# Patient Record
Sex: Female | Born: 1935 | Hispanic: No | Smoking: Never smoker
Health system: Southern US, Community
[De-identification: ages and names within clinical notes are randomized; demographics above are authoritative.]

## PROBLEM LIST (undated history)

## (undated) DIAGNOSIS — K5792 Diverticulitis of intestine, part unspecified, without perforation or abscess without bleeding: Secondary | ICD-10-CM

## (undated) DIAGNOSIS — I2510 Atherosclerotic heart disease of native coronary artery without angina pectoris: Secondary | ICD-10-CM

## (undated) DIAGNOSIS — I10 Essential (primary) hypertension: Secondary | ICD-10-CM

## (undated) HISTORY — PX: JOINT REPLACEMENT: SHX530

---

## 2001-12-09 ENCOUNTER — Ambulatory Visit (HOSPITAL_COMMUNITY): Admission: RE | Admit: 2001-12-09 | Discharge: 2001-12-09 | Payer: Self-pay | Admitting: Family Medicine

## 2001-12-09 ENCOUNTER — Encounter: Payer: Self-pay | Admitting: Family Medicine

## 2002-12-14 ENCOUNTER — Encounter: Payer: Self-pay | Admitting: *Deleted

## 2002-12-14 ENCOUNTER — Ambulatory Visit (HOSPITAL_COMMUNITY): Admission: RE | Admit: 2002-12-14 | Discharge: 2002-12-14 | Payer: Self-pay | Admitting: *Deleted

## 2003-01-05 ENCOUNTER — Ambulatory Visit (HOSPITAL_COMMUNITY): Admission: RE | Admit: 2003-01-05 | Discharge: 2003-01-05 | Payer: Self-pay | Admitting: *Deleted

## 2009-04-25 ENCOUNTER — Encounter: Payer: Self-pay | Admitting: Cardiovascular Disease

## 2009-05-06 ENCOUNTER — Encounter: Payer: Self-pay | Admitting: Cardiovascular Disease

## 2009-05-20 ENCOUNTER — Encounter: Payer: Self-pay | Admitting: Cardiovascular Disease

## 2009-05-23 ENCOUNTER — Ambulatory Visit (HOSPITAL_COMMUNITY): Admission: RE | Admit: 2009-05-23 | Discharge: 2009-05-23 | Payer: Self-pay | Admitting: Orthopedic Surgery

## 2009-05-28 ENCOUNTER — Telehealth (INDEPENDENT_AMBULATORY_CARE_PROVIDER_SITE_OTHER): Payer: Self-pay | Admitting: *Deleted

## 2009-06-22 DIAGNOSIS — K921 Melena: Secondary | ICD-10-CM

## 2009-06-25 ENCOUNTER — Ambulatory Visit: Payer: Self-pay | Admitting: Cardiovascular Disease

## 2009-06-25 DIAGNOSIS — R011 Cardiac murmur, unspecified: Secondary | ICD-10-CM

## 2009-06-25 DIAGNOSIS — I10 Essential (primary) hypertension: Secondary | ICD-10-CM

## 2009-06-28 ENCOUNTER — Ambulatory Visit: Payer: Self-pay

## 2009-06-28 ENCOUNTER — Encounter: Payer: Self-pay | Admitting: Cardiovascular Disease

## 2009-07-03 ENCOUNTER — Ambulatory Visit: Payer: Self-pay | Admitting: Cardiovascular Disease

## 2009-07-04 ENCOUNTER — Telehealth (INDEPENDENT_AMBULATORY_CARE_PROVIDER_SITE_OTHER): Payer: Self-pay | Admitting: *Deleted

## 2009-07-05 LAB — CONVERTED CEMR LAB
CO2: 32 meq/L (ref 19–32)
Calcium: 9.2 mg/dL (ref 8.4–10.5)
Creatinine, Ser: 0.6 mg/dL (ref 0.4–1.2)
GFR calc non Af Amer: 102.62 mL/min (ref >60)
Sodium: 141 meq/L (ref 135–145)

## 2009-07-18 ENCOUNTER — Inpatient Hospital Stay (HOSPITAL_COMMUNITY): Admission: RE | Admit: 2009-07-18 | Discharge: 2009-07-22 | Payer: Self-pay | Admitting: Orthopedic Surgery

## 2009-08-13 ENCOUNTER — Encounter (INDEPENDENT_AMBULATORY_CARE_PROVIDER_SITE_OTHER): Payer: Self-pay | Admitting: *Deleted

## 2009-08-19 ENCOUNTER — Ambulatory Visit: Payer: Self-pay | Admitting: Cardiovascular Disease

## 2009-08-19 DIAGNOSIS — I080 Rheumatic disorders of both mitral and aortic valves: Secondary | ICD-10-CM | POA: Insufficient documentation

## 2009-09-23 ENCOUNTER — Encounter: Admission: RE | Admit: 2009-09-23 | Discharge: 2009-09-23 | Payer: Self-pay | Admitting: Orthopedic Surgery

## 2009-11-13 ENCOUNTER — Encounter: Admission: RE | Admit: 2009-11-13 | Discharge: 2010-01-20 | Payer: Self-pay | Admitting: Neurology

## 2010-04-16 ENCOUNTER — Encounter: Admission: RE | Admit: 2010-04-16 | Discharge: 2010-04-16 | Payer: Self-pay | Admitting: Family Medicine

## 2010-09-11 IMAGING — CR DG HIP 1V PORT*L*
1 series · 1 of 1 positions shown · non-contrast
Comparison: None

CLINICAL DATA: History given of left hip arthroplasty procedure.

PORTABLE LEFT HIP - 1 VIEW

[AP]
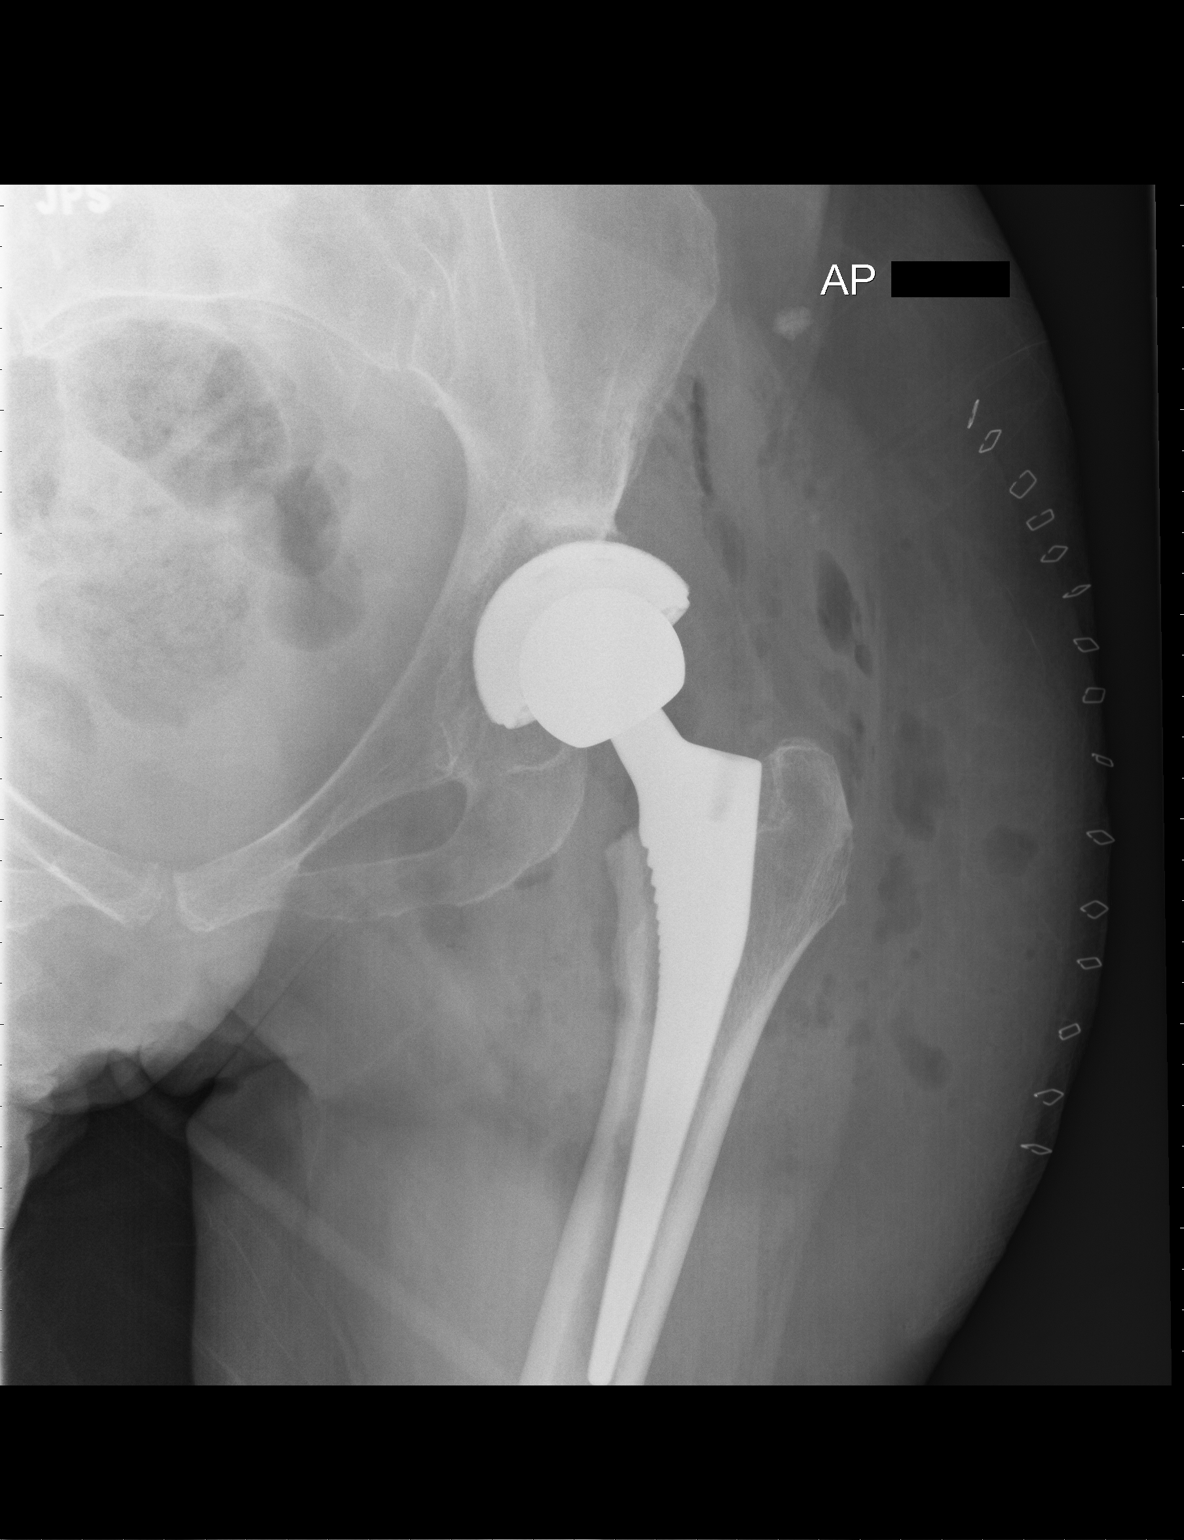

[1 of 1 positions shown; findings below may reference images not displayed]

FINDINGS: AP image shows a bipolar type hip arthroplasty procedure
has been performed.  There is expected relationship of the
acetabular and femoral components in the AP projection.  Soft
tissue edema and air is seen at the operative site.  Skin staples
are seen. There is a mildly osteopenic appearance of the bones.
IMPRESSION: Left hip arthroplasty procedure has been performed.

## 2010-10-13 ENCOUNTER — Encounter: Payer: Self-pay | Admitting: Cardiovascular Disease

## 2010-10-13 ENCOUNTER — Ambulatory Visit: Payer: Self-pay | Admitting: Cardiovascular Disease

## 2010-10-13 ENCOUNTER — Ambulatory Visit (HOSPITAL_COMMUNITY)
Admission: RE | Admit: 2010-10-13 | Discharge: 2010-10-13 | Payer: Self-pay | Source: Home / Self Care | Attending: Cardiovascular Disease | Admitting: Cardiovascular Disease

## 2010-11-16 ENCOUNTER — Encounter: Payer: Self-pay | Admitting: Family Medicine

## 2010-11-27 NOTE — Assessment & Plan Note (Signed)
Summary: 1 year fu appt/mt   Visit Type:  1 yr f/u Referring Provider:  Dr. Lajoyce Corners Primary Provider:  Larita Fife.[facility]  CC:  No complaints.  History of Present Illness: 77 yo WF with history of HTN, degenerative joint disease, diverticulitis seen in August 2010  for pre-operative risk assessment prior to planned hip replacement. She has been a healthy person for most of her life. She has never smoked and has never been treated for cardiac conditions. She denied any chest pain, SOB, palpitations, near syncope, syncope, PND, orthopnea, lower ext edema or awareness of irregularity of her heart rhythm. She was scheduled for left hip replacement but was found to have an abnormal EKG with LBBB and the surgery was delayed until she could be seen. Her echo showed normal function with LA enlargement and moderate MR. She had her left hip replacement on 07/18/09 and did well. She is here today for follow up. She has no complaints.    Repeat echo today with images reviewed by myself. Her LV function is still normal. Her MR is moderate but has not progressed. Both atria are enlarged. She has been feeling well. No chest pain or SOB. She has been on propranolol for some time. Her heart rate is always in the 40s. She denies dizziness, near syncope or syncope. NO chest pain or SOB. Her BP has been elevated lately. Her EKG today shows NSR with LBBB, chronic.    Current Medications (verified): 1)  Lisinopril 40 Mg Tabs (Lisinopril) .... Take One Tablet By Mouth Daily 2)  Propranolol Hcl Cr 160 Mg Xr24h-Cap (Propranolol Hcl) .Marland Kitchen.. 1 Cap Two Times A Day 3)  Multivitamins   Tabs (Multiple Vitamin) .Marland Kitchen.. 1 Tab Once Daily 4)  Calcium-Magnesium-Zinc 333-133-8.3 Mg Tabs (Calcium-Magnesium-Zinc) .... 2 Tabs Once Daily 5)  Fish Oil 1000 Mg Caps (Omega-3 Fatty Acids) .Marland Kitchen.. 1 Cap Once Daily 6)  Glucosamine-Chondroitin-Vit D3 1500-1200-800 Mg-Mg-Unit Pack (Glucosamine-Chondroitin-Vit D3) .Marland Kitchen.. 1 Tab Two Times A  Day 7)  Alprazolam 0.5 Mg Tabs (Alprazolam) .Marland Kitchen.. 1-2 To 2 Tab At Bedtime As Needed  Allergies: 1)  ! Pcn  Past History:  Past Medical History: HTN Diverticuitis Degenerative disease of the left hip Mitral regurgitation (moderate by echo 12/11)    Social History: Reviewed history from 06/25/2009 and no changes required. Retired Music therapist, 4 children No tobacco No alcohol No illicit drugs  Review of Systems  The patient denies fatigue, malaise, fever, weight gain/loss, vision loss, decreased hearing, hoarseness, chest pain, palpitations, shortness of breath, prolonged cough, wheezing, sleep apnea, coughing up blood, abdominal pain, blood in stool, nausea, vomiting, diarrhea, heartburn, incontinence, blood in urine, muscle weakness, joint pain, leg swelling, rash, skin lesions, headache, fainting, dizziness, depression, anxiety, enlarged lymph nodes, easy bruising or bleeding, and environmental allergies.    Vital Signs:  Patient profile:   77 year old female Height:      64.5 inches Weight:      126.50 pounds BMI:     21.46 Pulse rate:   48 / minute Pulse rhythm:   irregular BP sitting:   142 / 80  (left arm) Cuff size:   regular  Vitals Entered By: Danielle Rankin, CMA (October 13, 2010 10:45 AM)  Physical Exam  General:  General: Well developed, well nourished, NAD Musculoskeletal: Muscle strength 5/5 all ext Psychiatric: Mood and affect normal Neck: No JVD, no carotid bruits, no thyromegaly, no lymphadenopathy. Lungs:Clear bilaterally, no wheezes, rhonci, crackles CV: RRR with systolic  murmur, No  gallops rubs Abdomen: soft, NT, ND, BS present Extremities: No edema, pulses 2+.    EKG  Procedure date:  10/13/2010  Findings:      Sinus bradycardia, rate 48 bpm. LBBB (chronic)  Impression & Recommendations:  Problem # 1:  MITRAL REGURG W/ AORTIC INSUFF, RHEUM/NON-RHEUM (ICD-396.3) MR is moderate. Unchanged. Will repeat echo in one year.     Problem # 2:  ESSENTIAL HYPERTENSION, BENIGN (ICD-401.1) BP is elevated today. Will add Norvasc 5 mg by mouth Qdaily. Follow up BP in several weeks in primary care. She will arrange.   Her updated medication list for this problem includes:    Lisinopril 40 Mg Tabs (Lisinopril) .Marland Kitchen... Take one tablet by mouth daily    Propranolol Hcl Cr 160 Mg Xr24h-cap (Propranolol hcl) .Marland Kitchen... 1 cap two times a day    Amlodipine Besylate 5 Mg Tabs (Amlodipine besylate) .Marland Kitchen... Take one tablet by mouth daily  Other Orders: EKG w/ Interpretation (93000)  Patient Instructions: 1)  Your physician recommends that you schedule a follow-up appointment in: 1 year 2)  Your physician has recommended you make the following change in your medication:  START NORVASC 5mg  by mouth daily.  Prescriptions: AMLODIPINE BESYLATE 5 MG TABS (AMLODIPINE BESYLATE) Take one tablet by mouth daily  #30 x 8   Entered by:   Whitney Maeola Sarah RN   Authorized by:   Verne Carrow, MD   Signed by:   Ellender Hose RN on 10/13/2010   Method used:   Electronically to        [facility] [phone]* (retail)       [address]       [place], [place]  [address]       Ph: [phone]       Fax: [phone]   RxID:   [id number]

## 2011-01-30 LAB — COMPREHENSIVE METABOLIC PANEL
ALT: 12 U/L (ref 0–35)
AST: 21 U/L (ref 0–37)
Alkaline Phosphatase: 56 U/L (ref 39–117)
Calcium: 9.4 mg/dL (ref 8.4–10.5)
GFR calc Af Amer: >60 mL/min (ref >60)
Potassium: 3.6 mEq/L (ref 3.5–5.1)
Sodium: 139 mEq/L (ref 135–145)
Total Protein: 7.3 g/dL (ref 6.0–8.3)

## 2011-01-30 LAB — ABO/RH: ABO/RH(D): O POS

## 2011-01-30 LAB — CBC
HCT: 24.7 % — ABNORMAL LOW (ref 36.0–46.0)
HCT: 26 % — ABNORMAL LOW (ref 36.0–46.0)
Hemoglobin: 8.3 g/dL — ABNORMAL LOW (ref 12.0–15.0)
MCHC: 33.3 g/dL (ref 30.0–36.0)
MCHC: 33.8 g/dL (ref 30.0–36.0)
MCHC: 34.1 g/dL (ref 30.0–36.0)
MCV: 88.8 fL (ref 78.0–100.0)
MCV: 89.1 fL (ref 78.0–100.0)
Platelets: 128 10*3/uL — ABNORMAL LOW (ref 150–400)
RBC: 4.03 MIL/uL (ref 3.87–5.11)
RDW: 12.8 % (ref 11.5–15.5)
RDW: 13.1 % (ref 11.5–15.5)
RDW: 13.3 % (ref 11.5–15.5)

## 2011-01-30 LAB — CROSSMATCH: Antibody Screen: NEGATIVE

## 2011-01-30 LAB — URINALYSIS, ROUTINE W REFLEX MICROSCOPIC
Bilirubin Urine: NEGATIVE
Nitrite: NEGATIVE
Specific Gravity, Urine: 1.01 (ref 1.005–1.030)
Urobilinogen, UA: 0.2 mg/dL (ref 0.0–1.0)

## 2011-01-30 LAB — HEMOGLOBIN AND HEMATOCRIT, BLOOD
HCT: 22.4 % — ABNORMAL LOW (ref 36.0–46.0)
HCT: 24.4 % — ABNORMAL LOW (ref 36.0–46.0)
Hemoglobin: 7.6 g/dL — CL (ref 12.0–15.0)
Hemoglobin: 8.2 g/dL — ABNORMAL LOW (ref 12.0–15.0)

## 2011-01-30 LAB — BASIC METABOLIC PANEL
Chloride: 98 mEq/L (ref 96–112)
GFR calc Af Amer: >60 mL/min (ref >60)
Potassium: 3.7 mEq/L (ref 3.5–5.1)

## 2011-01-30 LAB — PROTIME-INR
INR: 1.3 (ref 0.00–1.49)
Prothrombin Time: 20.3 seconds — ABNORMAL HIGH (ref 11.6–15.2)

## 2011-02-01 LAB — CBC
MCHC: 33.5 g/dL (ref 30.0–36.0)
MCV: 87.5 fL (ref 78.0–100.0)
Platelets: 177 10*3/uL (ref 150–400)
RDW: 12.8 % (ref 11.5–15.5)

## 2011-02-01 LAB — COMPREHENSIVE METABOLIC PANEL
ALT: 16 U/L (ref 0–35)
AST: 22 U/L (ref 0–37)
Calcium: 9.5 mg/dL (ref 8.4–10.5)
Creatinine, Ser: 0.49 mg/dL (ref 0.4–1.2)
GFR calc Af Amer: >60 mL/min (ref >60)
Sodium: 136 mEq/L (ref 135–145)
Total Protein: 7.4 g/dL (ref 6.0–8.3)

## 2011-02-01 LAB — APTT: aPTT: 29 seconds (ref 24–37)

## 2011-03-13 NOTE — Op Note (Signed)
NAMETENESIA, ESCUDERO NO.:  [id number]   MEDICAL RECORD NO.:  [id number]                   PATIENT TYPE:  AMB   LOCATION:  ENDO                                 FACILITY:  [facility]   PHYSICIAN:  Sharyn Dross., M.D.               DATE OF BIRTH:  1929/07/15   DATE OF PROCEDURE:  01/05/2003  DATE OF DISCHARGE:                                 OPERATIVE REPORT   PREOPERATIVE DIAGNOSES:  1. Family history of colon cancer.  2. Blood in stools.   POSTOPERATIVE DIAGNOSIS:  Normal colonoscopic examination to the cecum.   PROCEDURE:  Colonoscopy.   MEDICATIONS:  Demerol 70 mg IV, Versed 5 mg IV over a 10-minute period of  time.   INSTRUMENTS:  Olympus video pancolonoscope.   ENDOSCOPIST:  Dortha Kern, M.D.   SUBJECTIVE:  See the previous note dated December 14, 2001.   The patient has been relatively stable since her initial visit into the  office up until recently when she came in with the complaint of lower GI  bleeding.  She states that she had bright blood that was noted per rectum  with a mixture of blood that was present.  She had some abdominal pains  which appeared to be in the right lower quadrant region at this time.  She  states that it has only been small amounts, but initially, it was  frightening to her because she felt that there was a large amount at this  time.  Because of a family history of colon cancer (which was tested  approximately three years ago), she came into the office for a possible  evaluation at this time.   OBJECTIVE FINDINGS:  GENERAL:  She is a pleasant female who appears to be in  no acute distress.  VITAL SIGNS:  Stable.  HEENT:  Her HEENT examination was positive for arcus senilis.  NECK:  Supple.  LUNGS:  Clear to auscultation and percussion.  HEART:  Grade 2-3/6 systolic murmur in the second right and fifth left  intercostal space without heaves or thrills noted.  ABDOMEN:  Soft.  There was no  hepatosplenomegaly appreciated.  EXTREMITIES:  Appeared to be within normal limits.   Presently, schedule the patient for the colonoscopic examination today, and  depending on the results of the exam, the course of therapy.   CURRENT MEDICATIONS:  The patient presently is taking Tenormin 25 mg daily  and Femiron daily.   CURRENT ALLERGIES:  No known allergies noted.   PREVIOUS SURGERIES:  Unremarkable.   REVIEW OF SYSTEMS:  Noncontributory.   INFORMED CONSENT:  The patient was advised of the procedure, the  indications, and the risks involved.  She has agreed to have the procedure  performed at this time.  The video was reviewed, and consent form was  obtained that was noted.   BOWEL PREP:  The patient was given Viscol  and Dulcolax tablets to take the  day before the procedure.  The patient tolerated the medication well without  any complications.  The quality of the prep was excellent.   PROCEDURE NOTE:  The instrument was advanced with the patient lying in the  left lateral position to approximately 80 cm from the proximal colon to the  cecum. This was confirmed by transillumination with visualization of the  ileocecal valve that was noticed at this time, as well as adequate  palpation.   There appeared to be no gross abnormalities such as masses, polyps, or  stricture lesions appreciated.  The vascular pattern appeared to be well  within normal limits throughout the entire colon.  The mucosal pattern  showed no evidence of any granular changes and no diverticular changes at  this time.  There were small amounts of stool that were present at this  point but otherwise no abnormalities noted.  There was no increased  tortuosity of the colon that was appreciated.   As the instrument was retracted back, it was retroflexed to visualize the  anal verge, which showed no evidence of any hemorrhoids that were present or  other mass lesions noted.  The instrument was subsequently  removed per  rectum without any difficulty, but the patient tolerated the procedure well.   TREATMENT:  1. Followup with me in two weeks.  2. Repeat the procedure in approximately five years.                                               Sharyn Dross., M.D.    JM/MEDQ  D:  01/05/2003  T:  01/06/2003  Job:  [id number]

## 2014-02-06 ENCOUNTER — Emergency Department (HOSPITAL_COMMUNITY): Payer: Medicare HMO

## 2014-02-06 ENCOUNTER — Emergency Department (HOSPITAL_COMMUNITY)
Admission: EM | Admit: 2014-02-06 | Discharge: 2014-02-06 | Disposition: A | Discharge status: Home / Self Care | Payer: Medicare HMO | Attending: Emergency Medicine | Admitting: Emergency Medicine

## 2014-02-06 ENCOUNTER — Encounter (HOSPITAL_COMMUNITY): Payer: Self-pay | Admitting: Emergency Medicine

## 2014-02-06 DIAGNOSIS — R42 Dizziness and giddiness: Secondary | ICD-10-CM | POA: Insufficient documentation

## 2014-02-06 DIAGNOSIS — R109 Unspecified abdominal pain: Secondary | ICD-10-CM | POA: Insufficient documentation

## 2014-02-06 DIAGNOSIS — R112 Nausea with vomiting, unspecified: Secondary | ICD-10-CM | POA: Insufficient documentation

## 2014-02-06 DIAGNOSIS — I2510 Atherosclerotic heart disease of native coronary artery without angina pectoris: Secondary | ICD-10-CM | POA: Insufficient documentation

## 2014-02-06 DIAGNOSIS — I10 Essential (primary) hypertension: Secondary | ICD-10-CM | POA: Insufficient documentation

## 2014-02-06 DIAGNOSIS — R0602 Shortness of breath: Secondary | ICD-10-CM | POA: Insufficient documentation

## 2014-02-06 DIAGNOSIS — Z8719 Personal history of other diseases of the digestive system: Secondary | ICD-10-CM | POA: Insufficient documentation

## 2014-02-06 DIAGNOSIS — Z880 Allergy status to penicillin: Secondary | ICD-10-CM | POA: Insufficient documentation

## 2014-02-06 DIAGNOSIS — R197 Diarrhea, unspecified: Secondary | ICD-10-CM | POA: Insufficient documentation

## 2014-02-06 HISTORY — DX: Atherosclerotic heart disease of native coronary artery without angina pectoris: I25.10

## 2014-02-06 HISTORY — DX: Essential (primary) hypertension: I10

## 2014-02-06 HISTORY — DX: Diverticulitis of intestine, part unspecified, without perforation or abscess without bleeding: K57.92

## 2014-02-06 LAB — CBC WITH DIFFERENTIAL/PLATELET
BASOS PCT: 0 % (ref 0–1)
Basophils Absolute: 0 10*3/uL (ref 0.0–0.1)
EOS ABS: 0 10*3/uL (ref 0.0–0.7)
Eosinophils Relative: 0 % (ref 0–5)
HCT: 37.6 % (ref 36.0–46.0)
HEMOGLOBIN: 13 g/dL (ref 12.0–15.0)
Lymphocytes Relative: 11 % — ABNORMAL LOW (ref 12–46)
Lymphs Abs: 1.1 10*3/uL (ref 0.7–4.0)
MCH: 29.9 pg (ref 26.0–34.0)
MCHC: 34.6 g/dL (ref 30.0–36.0)
MCV: 86.4 fL (ref 78.0–100.0)
MONOS PCT: 5 % (ref 3–12)
Monocytes Absolute: 0.6 10*3/uL (ref 0.1–1.0)
NEUTROS PCT: 84 % — AB (ref 43–77)
Neutro Abs: 8.6 10*3/uL — ABNORMAL HIGH (ref 1.7–7.7)
Platelets: 198 10*3/uL (ref 150–400)
RBC: 4.35 MIL/uL (ref 3.87–5.11)
RDW: 12.7 % (ref 11.5–15.5)
WBC: 10.3 10*3/uL (ref 4.0–10.5)

## 2014-02-06 LAB — COMPREHENSIVE METABOLIC PANEL
ALBUMIN: 3.8 g/dL (ref 3.5–5.2)
ALK PHOS: 55 U/L (ref 39–117)
ALT: 10 U/L (ref 0–35)
AST: 23 U/L (ref 0–37)
BUN: 15 mg/dL (ref 6–23)
CO2: 22 mEq/L (ref 19–32)
CREATININE: 0.68 mg/dL (ref 0.50–1.10)
Calcium: 9.3 mg/dL (ref 8.4–10.5)
Chloride: 103 mEq/L (ref 96–112)
GFR calc Af Amer: >90 mL/min (ref >90)
GFR calc non Af Amer: 79 mL/min — ABNORMAL LOW (ref >90)
Glucose, Bld: 140 mg/dL — ABNORMAL HIGH (ref 70–99)
POTASSIUM: 3.8 meq/L (ref 3.7–5.3)
Sodium: 141 mEq/L (ref 137–147)
TOTAL PROTEIN: 7.2 g/dL (ref 6.0–8.3)
Total Bilirubin: 0.4 mg/dL (ref 0.3–1.2)

## 2014-02-06 LAB — POC OCCULT BLOOD, ED: Fecal Occult Bld: NEGATIVE

## 2014-02-06 LAB — I-STAT TROPONIN, ED: TROPONIN I, POC: 0 ng/mL (ref 0.00–0.08)

## 2014-02-06 MED ORDER — IOHEXOL 300 MG/ML  SOLN
80.0000 mL | Freq: Once | INTRAMUSCULAR | Status: AC | PRN
  Administered 2014-02-06: 80 mL via INTRAVENOUS

## 2014-02-06 MED ORDER — ONDANSETRON HCL 4 MG/2ML IJ SOLN
INTRAMUSCULAR | Status: AC
  Filled 2014-02-06: qty 2

## 2014-02-06 MED ORDER — SODIUM CHLORIDE 0.9 % IV BOLUS (SEPSIS)
1000.0000 mL | Freq: Once | INTRAVENOUS | Status: AC
  Administered 2014-02-06: 1000 mL via INTRAVENOUS

## 2014-02-06 MED ORDER — ONDANSETRON HCL 8 MG PO TABS: 8.0000 mg | ORAL_TABLET | Freq: Three times a day (TID) | ORAL | Status: AC | PRN

## 2014-02-06 MED ORDER — ONDANSETRON HCL 4 MG/2ML IJ SOLN
4.0000 mg | Freq: Once | INTRAMUSCULAR | Status: AC
  Administered 2014-02-06: 4 mg via INTRAVENOUS

## 2014-02-06 NOTE — ED Notes (Signed)
Pt is here with 2 days history of LLQ abdominal pain, vomiting, diarrhea, now dizziness with sob.  Pt appears pale and anxious

## 2014-02-06 NOTE — ED Provider Notes (Signed)
CSN: [id number]     Arrival date & time 02/06/14  1642 History   First MD Initiated Contact with Patient 02/06/14 1804     Chief Complaint  Patient presents with  . Abdominal Pain  . Emesis  . Diarrhea  . Dizziness  . Shortness of Breath     (Consider location/radiation/quality/duration/timing/severity/associated sxs/prior Treatment) Patient is a 80 y.o. female presenting with abdominal pain, vomiting, diarrhea, dizziness, and shortness of breath. The history is provided by the patient and a relative.  Abdominal Pain Associated symptoms: diarrhea, shortness of breath and vomiting   Emesis Associated symptoms: abdominal pain and diarrhea   Diarrhea Associated symptoms: abdominal pain and vomiting   Dizziness Associated symptoms: diarrhea, shortness of breath and vomiting   Shortness of Breath Associated symptoms: abdominal pain and vomiting    She complains of nausea, vomiting, and diarrhea that started 2 days ago. She is unable to eat or drink anything. She has had low-grade fever, and mild, diffuse abdominal tenderness. She has not seen any blood in the emesis or stool. Stool is been consistency, like water. She has not had a cough, shortness of breath, or chest pain. She does not have dizziness, or weakness. There are no other known modifying factors.  Past Medical History  Diagnosis Date  . Coronary artery disease   . Diverticulitis   . Hypertension    Past Surgical History  Procedure Laterality Date  . Joint replacement      hip replacement   No family history on file. History  Substance Use Topics  . Smoking status: Never Smoker   . Smokeless tobacco: Not on file  . Alcohol Use: No   OB History   Grav Para Term Preterm Abortions TAB SAB Ect Mult Living                 Review of Systems  Respiratory: Positive for shortness of breath.   Gastrointestinal: Positive for vomiting, abdominal pain and diarrhea.  Neurological: Positive for dizziness.  All other  systems reviewed and are negative.     Allergies  Aspirin; Lactulose; Milk-related compounds; and Penicillins  Home Medications   Prior to Admission medications   Not on File   BP 121/57  Pulse 80  Temp(Src) 99.9 F (37.7 C) (Rectal)  Resp 18  Wt 133 lb (60.328 kg)  SpO2 100% Physical Exam  Nursing note and vitals reviewed. Constitutional: She is oriented to person, place, and time. She appears well-developed.  Elderly, frail  HENT:  Head: Normocephalic and atraumatic.  Eyes: Conjunctivae and EOM are normal. Pupils are equal, round, and reactive to light.  Neck: Normal range of motion and phonation normal. Neck supple.  Cardiovascular: Normal rate, regular rhythm and intact distal pulses.   Pulmonary/Chest: Effort normal and breath sounds normal. She exhibits no tenderness.  Abdominal: Soft. She exhibits no distension and no mass. There is tenderness (Diffuse, mild. Hyperactive bowel sounds). There is no rebound and no guarding.  Musculoskeletal: Normal range of motion.  Neurological: She is alert and oriented to person, place, and time. She exhibits normal muscle tone.  Skin: Skin is warm and dry.  Psychiatric: She has a normal mood and affect. Her behavior is normal. Judgment and thought content normal.    ED Course  Procedures (including critical care time)   Medications  ondansetron (ZOFRAN) injection 4 mg (4 mg Intravenous Given 02/06/14 1715)  sodium chloride 0.9 % bolus 1,000 mL (0 mLs Intravenous Stopped 02/06/14 1916)  iohexol (OMNIPAQUE) 300  MG/ML solution 80 mL (80 mLs Intravenous Contrast Given 02/06/14 1932)    Patient Vitals for the past 24 hrs:  BP Temp Temp src Pulse Resp SpO2 Weight  02/06/14 1908 121/57 mmHg - - 80 18 100 % -  02/06/14 1900 121/57 mmHg - - 79 17 96 % -  02/06/14 1814 - 99.9 F (37.7 C) Rectal - - - -  02/06/14 1800 101/50 mmHg - - 77 16 100 % -  02/06/14 1751 103/54 mmHg - - - - - -  02/06/14 1745 103/54 mmHg - - 69 16 90 % -   02/06/14 1733 94/44 mmHg 98.2 F (36.8 C) Oral 66 22 100 % -  02/06/14 1653 - - - - 24 - -  02/06/14 1646 131/88 mmHg 97.9 F (36.6 C) Oral 90 16 100 % 133 lb (60.328 kg)    8:47 PM Reevaluation with update and discussion. After initial assessment and treatment, an updated evaluation reveals she is feeling better. Flint MelterElliott L Versie Soave    Labs Review Labs Reviewed  CBC WITH DIFFERENTIAL - Abnormal; Notable for the following:    Neutrophils Relative % 84 (*)    Neutro Abs 8.6 (*)    Lymphocytes Relative 11 (*)    All other components within normal limits  COMPREHENSIVE METABOLIC PANEL - Abnormal; Notable for the following:    Glucose, Bld 140 (*)    GFR calc non Af Amer 79 (*)    All other components within normal limits  URINALYSIS, ROUTINE W REFLEX MICROSCOPIC  I-STAT TROPOININ, ED  POC OCCULT BLOOD, ED    Imaging Review Ct Abdomen Pelvis W Contrast  02/06/2014   CLINICAL DATA:  Left lower quadrant abdominal pain. Vomiting and diarrhea. Dizziness and shortness of breath. Pallor. Anxiety.  EXAM: CT ABDOMEN AND PELVIS WITH CONTRAST  TECHNIQUE: Multidetector CT imaging of the abdomen and pelvis was performed using the standard protocol following bolus administration of intravenous contrast.  CONTRAST:  80mL OMNIPAQUE IOHEXOL 300 MG/ML  SOLN  COMPARISON:  None.  FINDINGS: Dependent subsegmental atelectasis in both lower lobes.  Left Main coronary artery atherosclerotic calcification noted. Mild cardiomegaly.  Mild perihepatic and perisplenic ascites. Linear lucency along the posteromedial margin of the spleen is probably a cleft rather than a laceration given the lack of history of trauma. 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  Liver and pancreas unremarkable. Adrenal glands normal. Gallbladder surgically absent. Common bile duct measures up to 1.1 cm in diameter.  Abnormal circumferential wall thickening observed in the stomach antrum. Fold thickening is  present in the duodenal bulb.  Mild aortoiliac atherosclerotic vascular disease noted.  There is greater than expected enhancement in the walls of both proximal and mid ureters, without an obvious filling defect. The distal ureters are indistinct, partially due to streak artifact from a left hip implant. Small ureterocele skull left greater than right. Mild urinary bladder wall thickening. No heterogeneity of renal enhancement observed.  Bony erosion along the upper margin of the acetabular cup favors particulate disease.  There is nonvisualization of the appendix.  Subtle wall thickening noted in loops of jejunum and ileum. Trace pelvic ascites. Mild wall thickening in the sigmoid colon diffusely.  No pathologic pelvic adenopathy is observed.  Calcified disc bulges at L4-5 and L5-S1. Left lateral recess disc protrusion suspected at L5-S1. Loss of disc height at L3-4.  IMPRESSION: 1. Abnormal wall thickening in the stomach antrum and duodenal bulb favoring antritis and duodenitis/ peptic ulcer disease. 2. There  are also scattered areas of wall thickening in the duodenum, jejunum, and colon which could reflect enteritis/colitis, or conceivably Crohn's disease. 3. Perihepatic and perisplenic ascites, nonspecific. Mild pelvic ascites. 4. Urinary bladder wall thickening is present along with mild but abnormal wall enhancement of segments of both ureters. This could reflect cystitis with ureteral right S. No compelling abnormal enhancement in the kidneys to suggest pyelonephritis. 5. Small bilateral ureterocele is along the posterior bladder margin. The 6. Bony erosion favoring particulate disease along the super margin of the left acetabular shell. 7. Lumbar degenerative disc disease. 8. Coronary artery atherosclerosis with mild cardiomegaly. 9. Nonvisualization of the appendix, reducing negative predictive value for appendicitis.   Electronically Signed   By: Herbie BaltimoreWalt  Liebkemann M.D.   On: 02/06/2014 20:10     EKG  Interpretation   Date/Time:  Tuesday February 06 2014 16:51:10 EDT Ventricular Rate:  96 PR Interval:  150 QRS Duration: 126 QT Interval:  406 QTC Calculation: 512 R Axis:   72 Text Interpretation:  Normal sinus rhythm Left ventricular hypertrophy  with QRS widening and repolarization abnormality Abnormal ECG non specific  t wave changes since last tracing Confirmed by PICKERING  MD, Harrold DonathNATHAN  [phone]([id number]) on 02/06/2014 4:58:34 PM      MDM   Final diagnoses:  Nausea vomiting and diarrhea    Nausea, vomiting, and diarrhea with history of diverticulosis, but no evidence for diverticulitis, or intra-abdominal abscess. There were several minor abnormalities noted on the CT scan none of which are causative for her current symptoms. And they all likely are incidental, and will be unlikely to require ongoing management.   Nursing Notes Reviewed/ Care Coordinated Applicable Imaging Reviewed Interpretation of Laboratory Data incorporated into ED treatment  The patient appears reasonably screened and/or stabilized for discharge and I doubt any other medical condition or other [facility] requiring further screening, evaluation, or treatment in the ED at this time prior to discharge.  Plan: Home Medications- Zofran; Home Treatments- gradually advance diet; return here if the recommended treatment, does not improve the symptoms; Recommended follow up- PCP f/u 1 week    Flint MelterElliott L Nuchem Grattan, MD 02/06/14 2050

## 2014-02-06 NOTE — Discharge Instructions (Signed)
Start with clear liquids, and gradually advance your diet over 3 days. See her doctor next week, as scheduled. Call him sooner if needed, for problems. Discuss the CAT scan results with your Physician.  There are several minor abnormalities that he will like to know about. This should not impact you in the short term. If he cannot access the records, through [facility], you can request that the records, be sent to him.  Nausea and Vomiting Nausea is a sick feeling that often comes before throwing up (vomiting). Vomiting is a reflex where stomach contents come out of your mouth. Vomiting can cause severe loss of body fluids (dehydration). Children and elderly adults can become dehydrated quickly, especially if they also have diarrhea. Nausea and vomiting are symptoms of a condition or disease. It is important to find the cause of your symptoms. CAUSES   Direct irritation of the stomach lining. This irritation can result from increased acid production (gastroesophageal reflux disease), infection, food poisoning, taking certain medicines (such as nonsteroidal anti-inflammatory drugs), alcohol use, or tobacco use.  Signals from the brain.These signals could be caused by a headache, heat exposure, an inner ear disturbance, increased pressure in the brain from injury, infection, a tumor, or a concussion, pain, emotional stimulus, or metabolic problems.  An obstruction in the gastrointestinal tract (bowel obstruction).  Illnesses such as diabetes, hepatitis, gallbladder problems, appendicitis, kidney problems, cancer, sepsis, atypical symptoms of a heart attack, or eating disorders.  Medical treatments such as chemotherapy and radiation.  Receiving medicine that makes you sleep (general anesthetic) during surgery. DIAGNOSIS Your caregiver may ask for tests to be done if the problems do not improve after a few days. Tests may also be done if symptoms are severe or if the reason for the nausea and vomiting is  not clear. Tests may include:  Urine tests.  Blood tests.  Stool tests.  Cultures (to look for evidence of infection).  X-rays or other imaging studies. Test results can help your caregiver make decisions about treatment or the need for additional tests. TREATMENT You need to stay well hydrated. Drink frequently but in small amounts.You may wish to drink water, sports drinks, clear broth, or eat frozen ice pops or gelatin dessert to help stay hydrated.When you eat, eating slowly may help prevent nausea.There are also some antinausea medicines that may help prevent nausea. HOME CARE INSTRUCTIONS   Take all medicine as directed by your caregiver.  If you do not have an appetite, do not force yourself to eat. However, you must continue to drink fluids.  If you have an appetite, eat a normal diet unless your caregiver tells you differently.  Eat a variety of complex carbohydrates (rice, wheat, potatoes, bread), lean meats, yogurt, fruits, and vegetables.  Avoid high-fat foods because they are more difficult to digest.  Drink enough water and fluids to keep your urine clear or pale yellow.  If you are dehydrated, ask your caregiver for specific rehydration instructions. Signs of dehydration may include:  Severe thirst.  Dry lips and mouth.  Dizziness.  Dark urine.  Decreasing urine frequency and amount.  Confusion.  Rapid breathing or pulse. SEEK IMMEDIATE MEDICAL CARE IF:   You have blood or brown flecks (like coffee grounds) in your vomit.  You have black or bloody stools.  You have a severe headache or stiff neck.  You are confused.  You have severe abdominal pain.  You have chest pain or trouble breathing.  You do not urinate at least once  every 8 hours.  You develop cold or clammy skin.  You continue to vomit for longer than 24 to 48 hours.  You have a fever. MAKE SURE YOU:   Understand these instructions.  Will watch your condition.  Will get  help right away if you are not doing well or get worse. Document Released: 10/12/2005 Document Revised: 01/04/2012 Document Reviewed: 03/11/2011 [facility]Exit[facility] Patient Information 2014 [place], [place].  Diarrhea Diarrhea is frequent loose and watery bowel movements. It can cause you to feel weak and dehydrated. Dehydration can cause you to become tired and thirsty, have a dry mouth, and have decreased urination that often is dark yellow. Diarrhea is a sign of another problem, most often an infection that will not last long. In most cases, diarrhea typically lasts 2 3 days. However, it can last longer if it is a sign of something more serious. It is important to treat your diarrhea as directed by your caregive to lessen or prevent future episodes of diarrhea. CAUSES  Some common causes include:  Gastrointestinal infections caused by viruses, bacteria, or parasites.  Food poisoning or food allergies.  Certain medicines, such as antibiotics, chemotherapy, and laxatives.  Artificial sweeteners and fructose.  Digestive disorders. HOME CARE INSTRUCTIONS  Ensure adequate fluid intake (hydration): have 1 cup (8 oz) of fluid for each diarrhea episode. Avoid fluids that contain simple sugars or sports drinks, fruit juices, whole milk products, and sodas. Your urine should be clear or pale yellow if you are drinking enough fluids. Hydrate with an oral rehydration solution that you can purchase at pharmacies, retail stores, and online. You can prepare an oral rehydration solution at home by mixing the following ingredients together:    tsp table salt.   tsp baking soda.   tsp salt substitute containing potassium chloride.  1  tablespoons sugar.  1 L (34 oz) of water.  Certain foods and beverages may increase the speed at which food moves through the gastrointestinal (GI) tract. These foods and beverages should be avoided and include:  Caffeinated and alcoholic beverages.  High-fiber foods, such as  raw fruits and vegetables, nuts, seeds, and whole grain breads and cereals.  Foods and beverages sweetened with sugar alcohols, such as xylitol, sorbitol, and mannitol.  Some foods may be well tolerated and may help thicken stool including:  Starchy foods, such as rice, toast, pasta, low-sugar cereal, oatmeal, grits, baked potatoes, crackers, and bagels.  Bananas.  Applesauce.  Add probiotic-rich foods to help increase healthy bacteria in the GI tract, such as yogurt and fermented milk products.  Wash your hands well after each diarrhea episode.  Only take over-the-counter or prescription medicines as directed by your caregiver.  Take a warm bath to relieve any burning or pain from frequent diarrhea episodes. SEEK IMMEDIATE MEDICAL CARE IF:   You are unable to keep fluids down.  You have persistent vomiting.  You have blood in your stool, or your stools are black and tarry.  You do not urinate in 6 8 hours, or there is only a small amount of very dark urine.  You have abdominal pain that increases or localizes.  You have weakness, dizziness, confusion, or lightheadedness.  You have a severe headache.  Your diarrhea gets worse or does not get better.  You have a fever or persistent symptoms for more than 2 3 days.  You have a fever and your symptoms suddenly get worse. MAKE SURE YOU:   Understand these instructions.  Will watch your condition.  Will get help right away if you are not doing well or get worse. Document Released: 10/02/2002 Document Revised: 09/28/2012 Document Reviewed: 06/19/2012 [facility]Exit[facility] Patient Information 2014 [place], [place].

## 2014-02-06 NOTE — ED Notes (Signed)
Dr. Rubin PayorPickering signed EKG.  Pt has right neck pain as well

## 2015-04-02 IMAGING — CT CT ABD-PELV W/ CM
2 of 5 series · 15 of 46 positions shown, 17 images · IV contrast (APPLIED)
Comparison: None.

CLINICAL DATA: Left lower quadrant abdominal pain. Vomiting and
diarrhea. Dizziness and shortness of breath. Pallor. Anxiety.

EXAM:
CT ABDOMEN AND PELVIS WITH CONTRAST
TECHNIQUE: Multidetector CT imaging of the abdomen and pelvis was performed
using the standard protocol following bolus administration of
intravenous contrast.
CONTRAST:  80mL OMNIPAQUE IOHEXOL 300 MG/ML  SOLN

[Series 2: abd/ pelvis 5.0 i30f 1 · axial · 0.74mm/px · z∈[+513,+913]mm · 12 of 92 slices shown, 14 images]
[im 6/92  soft-tissue]
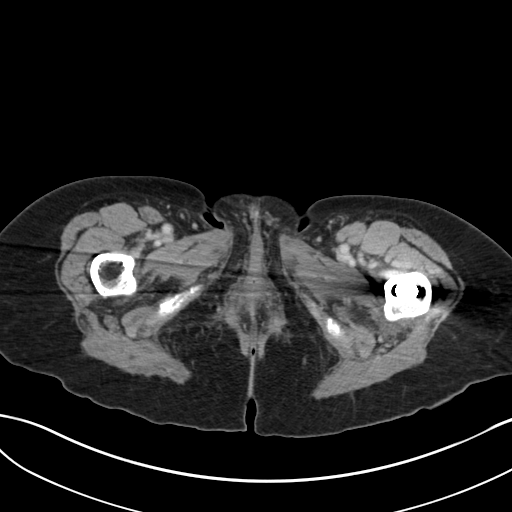
[im 6/92  bone]
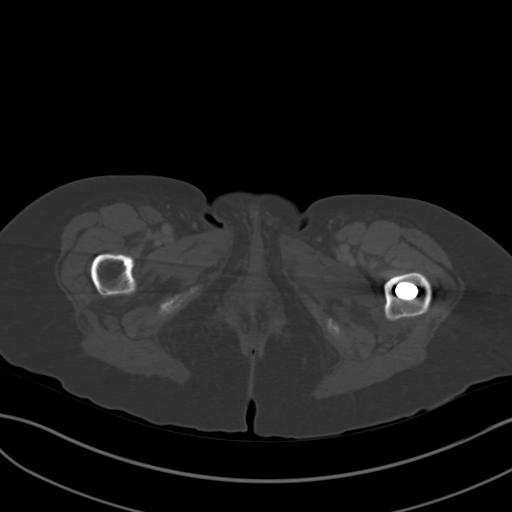
[im 16/92  soft-tissue]
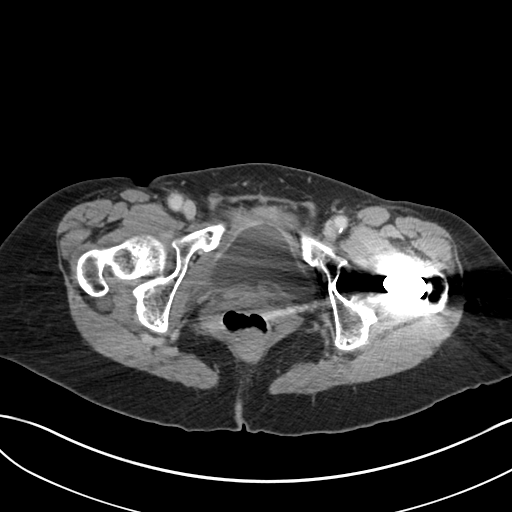
[im 21/92  soft-tissue]
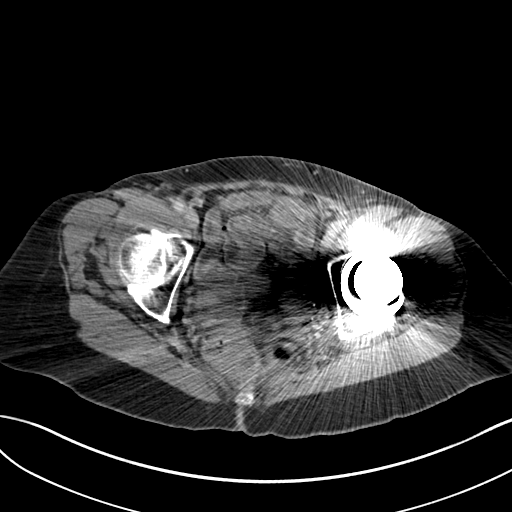
[im 26/92  soft-tissue]
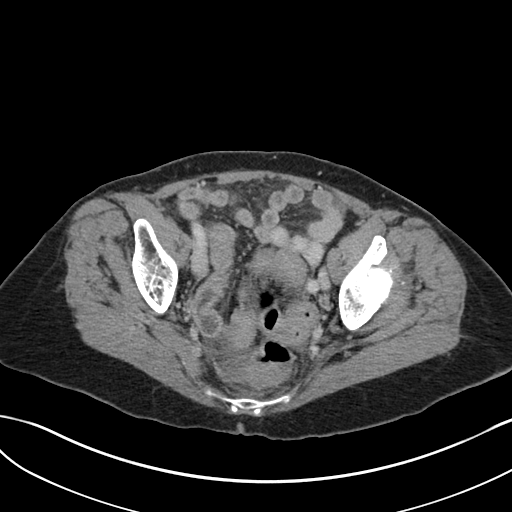
[im 36/92  soft-tissue]
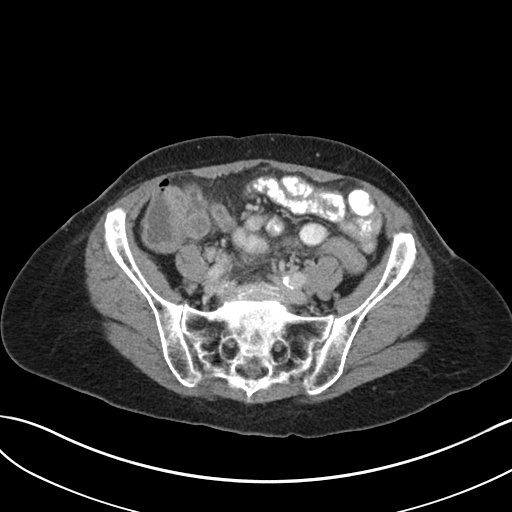
[im 41/92  soft-tissue]
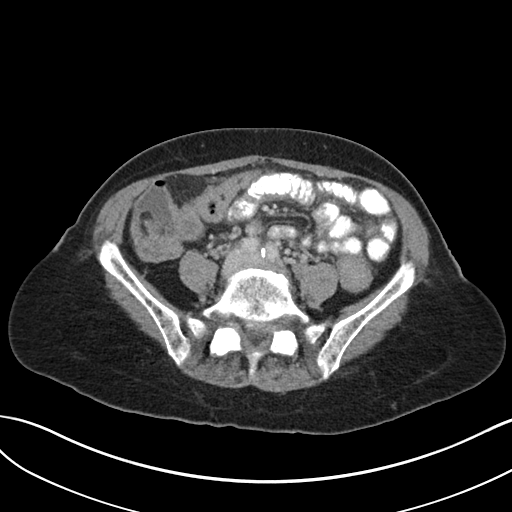
[im 51/92  soft-tissue]
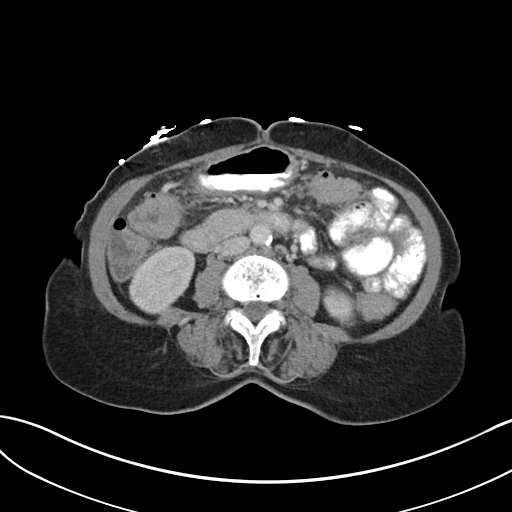
[im 56/92  soft-tissue]
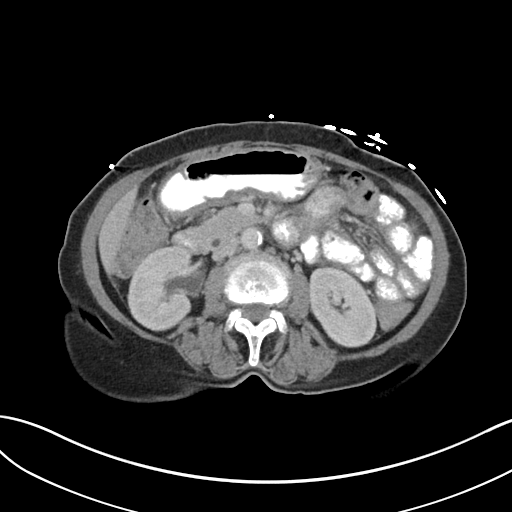
[im 66/92  soft-tissue]
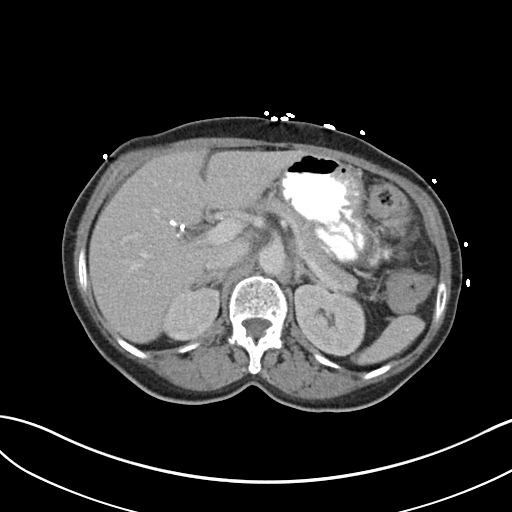
[im 66/92  bone]
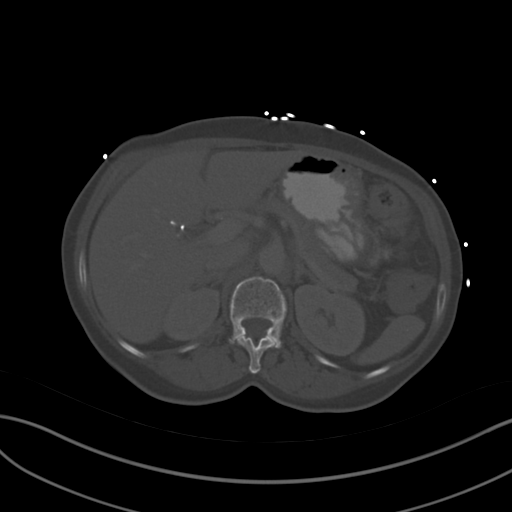
[im 71/92  soft-tissue]
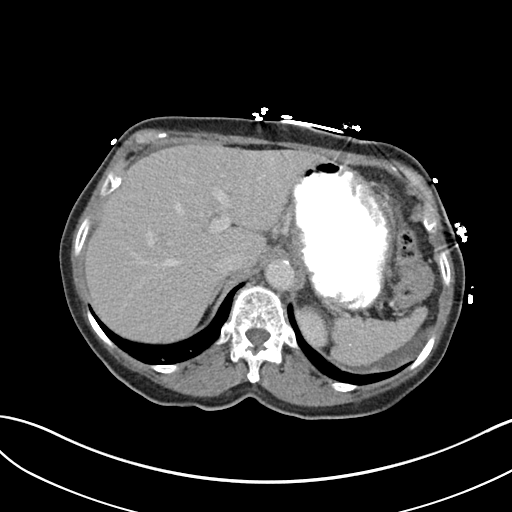
[im 76/92  soft-tissue]
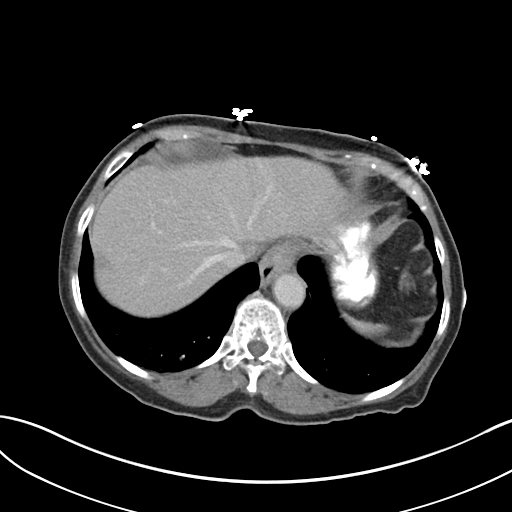
[im 86/92  soft-tissue]
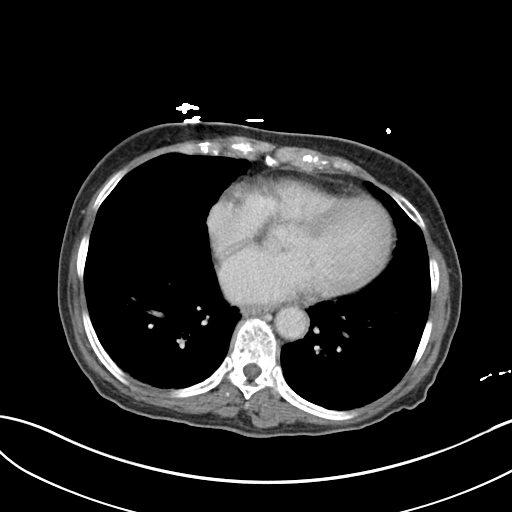

[Series 5: coronal soft tissue · coronal · 0.79mm/px · 3 of 72 slices shown]
[im 24/72  soft-tissue]
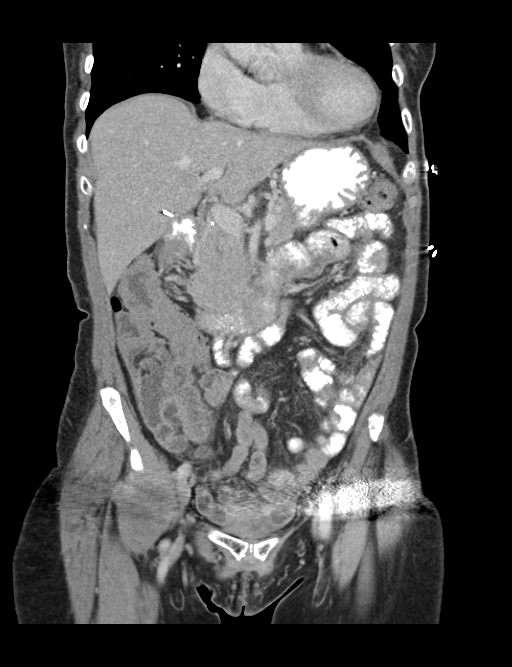
[im 32/72  soft-tissue]
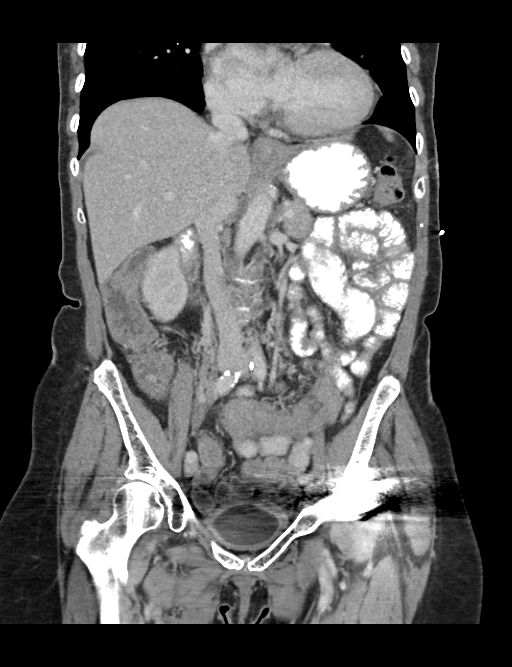
[im 40/72  soft-tissue]
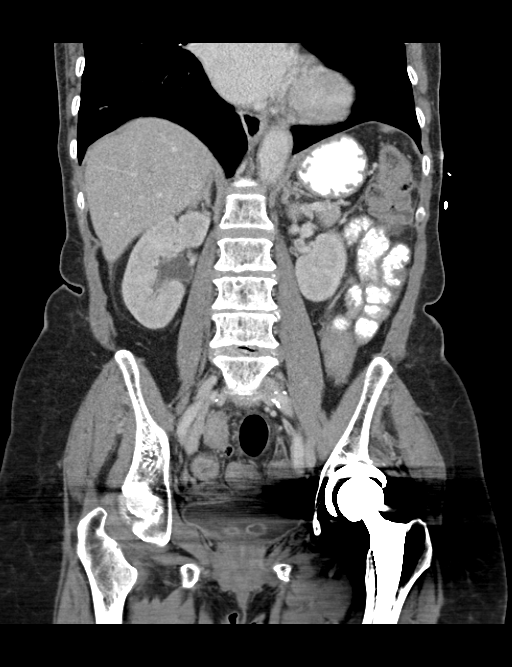

[15 of 46 positions shown; findings below may reference images not displayed]

FINDINGS: Dependent subsegmental atelectasis in both lower lobes.

Left Main coronary artery atherosclerotic calcification noted. Mild
cardiomegaly.

Mild perihepatic and perisplenic ascites. Linear lucency along the
posteromedial margin of the spleen is probably a cleft rather than a
laceration given the lack of history of trauma. Punctate
calcifications in the spleen likely reflect remote histoplasmosis.

Liver and pancreas unremarkable. Adrenal glands normal. Gallbladder
surgically absent. Common bile duct measures up to 1.1 cm in
diameter.

Abnormal circumferential wall thickening observed in the stomach
antrum. Fold thickening is present in the duodenal bulb.

Mild aortoiliac atherosclerotic vascular disease noted.

There is greater than expected enhancement in the walls of both
proximal and mid ureters, without an obvious filling defect. The
distal ureters are indistinct, partially due to streak artifact from
a left hip implant. Small ureterocele skull left greater than right.
Mild urinary bladder wall thickening. No heterogeneity of renal
enhancement observed.

Bony erosion along the upper margin of the acetabular cup favors
particulate disease.

There is nonvisualization of the appendix.

Subtle wall thickening noted in loops of jejunum and ileum. Trace
pelvic ascites. Mild wall thickening in the sigmoid colon diffusely.

No pathologic pelvic adenopathy is observed.

Calcified disc bulges at L4-5 and L5-S1. Left lateral recess disc
protrusion suspected at L5-S1. Loss of disc height at L3-4.
IMPRESSION: 1. Abnormal wall thickening in the stomach antrum and duodenal bulb
favoring antritis and duodenitis/ peptic ulcer disease.
2. There are also scattered areas of wall thickening in the
duodenum, jejunum, and colon which could reflect enteritis/colitis,
or conceivably Crohn's disease.
3. Perihepatic and perisplenic ascites, nonspecific. Mild pelvic
ascites.
4. Urinary bladder wall thickening is present along with mild but
abnormal wall enhancement of segments of both ureters. This could
reflect cystitis with ureteral right S. No compelling abnormal
enhancement in the kidneys to suggest pyelonephritis.
5. Small bilateral ureterocele is along the posterior bladder
margin. The
6. Bony erosion favoring particulate disease along the super margin
of the left acetabular shell.
7. Lumbar degenerative disc disease.
8. Coronary artery atherosclerosis with mild cardiomegaly.
9. Nonvisualization of the appendix, reducing negative predictive
value for appendicitis.

## 2023-02-24 DEATH — deceased
